# Patient Record
Sex: Female | Born: 2000 | Race: Black or African American | Hispanic: No | Marital: Single | Smoking: Never smoker
Health system: Southern US, Community
[De-identification: ages and names within clinical notes are randomized; demographics above are authoritative.]

## PROBLEM LIST (undated history)

## (undated) HISTORY — PX: WISDOM TOOTH EXTRACTION: SHX21

---
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
## 2018-08-14 ENCOUNTER — Ambulatory Visit: Payer: BLUE CROSS/BLUE SHIELD | Admitting: Women's Health

## 2018-08-14 ENCOUNTER — Encounter: Payer: Self-pay | Admitting: Women's Health

## 2018-08-14 VITALS — BP 110/78 | Ht 63.0 in | Wt 119.0 lb

## 2018-08-14 DIAGNOSIS — N920 Excessive and frequent menstruation with regular cycle: Secondary | ICD-10-CM | POA: Diagnosis not present

## 2018-08-14 DIAGNOSIS — Z113 Encounter for screening for infections with a predominantly sexual mode of transmission: Secondary | ICD-10-CM

## 2018-08-14 DIAGNOSIS — Z01419 Encounter for gynecological examination (general) (routine) without abnormal findings: Secondary | ICD-10-CM

## 2018-08-14 LAB — WET PREP FOR TRICH, YEAST, CLUE

## 2018-08-14 MED ORDER — NORETHIN ACE-ETH ESTRAD-FE 1-20 MG-MCG PO TABS: 1.0000 | ORAL_TABLET | Freq: Every day | ORAL | 4 refills | Status: AC

## 2018-08-14 NOTE — Patient Instructions (Signed)
Health Maintenance, Female Adopting a healthy lifestyle and getting preventive care can go a long way to promote health and wellness. Talk with your health care provider about what schedule of regular examinations is right for you. This is a good chance for you to check in with your provider about disease prevention and staying healthy. In between checkups, there are plenty of things you can do on your own. Experts have done a lot of research about which lifestyle changes and preventive measures are most likely to keep you healthy. Ask your health care provider for more information. Weight and diet Eat a healthy diet  Be sure to include plenty of vegetables, fruits, low-fat dairy products, and lean protein.  Do not eat a lot of foods high in solid fats, added sugars, or salt.  Get regular exercise. This is one of the most important things you can do for your health. ? Most adults should exercise for at least 150 minutes each week. The exercise should increase your heart rate and make you sweat (moderate-intensity exercise). ? Most adults should also do strengthening exercises at least twice a week. This is in addition to the moderate-intensity exercise.  Maintain a healthy weight  Body mass index (BMI) is a measurement that can be used to identify possible weight problems. It estimates body fat based on height and weight. Your health care provider can help determine your BMI and help you achieve or maintain a healthy weight.  For females 20 years of age and older: ? A BMI below 18.5 is considered underweight. ? A BMI of 18.5 to 24.9 is normal. ? A BMI of 25 to 29.9 is considered overweight. ? A BMI of 30 and above is considered obese.  Watch levels of cholesterol and blood lipids  You should start having your blood tested for lipids and cholesterol at 20 years of age, then have this test every 5 years.  You may need to have your cholesterol levels checked more often if: ? Your lipid or  cholesterol levels are high. ? You are older than 19 years of age. ? You are at high risk for heart disease.  Cancer screening Lung Cancer  Lung cancer screening is recommended for adults 55-80 years old who are at high risk for lung cancer because of a history of smoking.  A yearly low-dose CT scan of the lungs is recommended for people who: ? Currently smoke. ? Have quit within the past 15 years. ? Have at least a 30-pack-year history of smoking. A pack year is smoking an average of one pack of cigarettes a day for 1 year.  Yearly screening should continue until it has been 15 years since you quit.  Yearly screening should stop if you develop a health problem that would prevent you from having lung cancer treatment.  Breast Cancer  Practice breast self-awareness. This means understanding how your breasts normally appear and feel.  It also means doing regular breast self-exams. Let your health care provider know about any changes, no matter how small.  If you are in your 20s or 30s, you should have a clinical breast exam (CBE) by a health care provider every 1-3 years as part of a regular health exam.  If you are 40 or older, have a CBE every year. Also consider having a breast X-ray (mammogram) every year.  If you have a family history of breast cancer, talk to your health care provider about genetic screening.  If you are at high risk   for breast cancer, talk to your health care provider about having an MRI and a mammogram every year.  Breast cancer gene (BRCA) assessment is recommended for women who have family members with BRCA-related cancers. BRCA-related cancers include: ? Breast. ? Ovarian. ? Tubal. ? Peritoneal cancers.  Results of the assessment will determine the need for genetic counseling and BRCA1 and BRCA2 testing.  Cervical Cancer Your health care provider may recommend that you be screened regularly for cancer of the pelvic organs (ovaries, uterus, and  vagina). This screening involves a pelvic examination, including checking for microscopic changes to the surface of your cervix (Pap test). You may be encouraged to have this screening done every 3 years, beginning at age 22.  For women ages 56-65, health care providers may recommend pelvic exams and Pap testing every 3 years, or they may recommend the Pap and pelvic exam, combined with testing for human papilloma virus (HPV), every 5 years. Some types of HPV increase your risk of cervical cancer. Testing for HPV may also be done on women of any age with unclear Pap test results.  Other health care providers may not recommend any screening for nonpregnant women who are considered low risk for pelvic cancer and who do not have symptoms. Ask your health care provider if a screening pelvic exam is right for you.  If you have had past treatment for cervical cancer or a condition that could lead to cancer, you need Pap tests and screening for cancer for at least 20 years after your treatment. If Pap tests have been discontinued, your risk factors (such as having a new sexual partner) need to be reassessed to determine if screening should resume. Some women have medical problems that increase the chance of getting cervical cancer. In these cases, your health care provider may recommend more frequent screening and Pap tests.  Colorectal Cancer  This type of cancer can be detected and often prevented.  Routine colorectal cancer screening usually begins at 19 years of age and continues through 19 years of age.  Your health care provider may recommend screening at an earlier age if you have risk factors for colon cancer.  Your health care provider may also recommend using home test kits to check for hidden blood in the stool.  A small camera at the end of a tube can be used to examine your colon directly (sigmoidoscopy or colonoscopy). This is done to check for the earliest forms of colorectal  cancer.  Routine screening usually begins at age 33.  Direct examination of the colon should be repeated every 5-10 years through 19 years of age. However, you may need to be screened more often if early forms of precancerous polyps or small growths are found.  Skin Cancer  Check your skin from head to toe regularly.  Tell your health care provider about any new moles or changes in moles, especially if there is a change in a mole's shape or color.  Also tell your health care provider if you have a mole that is larger than the size of a pencil eraser.  Always use sunscreen. Apply sunscreen liberally and repeatedly throughout the day.  Protect yourself by wearing long sleeves, pants, a wide-brimmed hat, and sunglasses whenever you are outside.  Heart disease, diabetes, and high blood pressure  High blood pressure causes heart disease and increases the risk of stroke. High blood pressure is more likely to develop in: ? People who have blood pressure in the high end of  the normal range (130-139/85-89 mm Hg). ? People who are overweight or obese. ? People who are African American.  If you are 21-29 years of age, have your blood pressure checked every 3-5 years. If you are 3 years of age or older, have your blood pressure checked every year. You should have your blood pressure measured twice-once when you are at a hospital or clinic, and once when you are not at a hospital or clinic. Record the average of the two measurements. To check your blood pressure when you are not at a hospital or clinic, you can use: ? An automated blood pressure machine at a pharmacy. ? A home blood pressure monitor.  If you are between 17 years and 37 years old, ask your health care provider if you should take aspirin to prevent strokes.  Have regular diabetes screenings. This involves taking a blood sample to check your fasting blood sugar level. ? If you are at a normal weight and have a low risk for diabetes,  have this test once every three years after 19 years of age. ? If you are overweight and have a high risk for diabetes, consider being tested at a younger age or more often. Preventing infection Hepatitis B  If you have a higher risk for hepatitis B, you should be screened for this virus. You are considered at high risk for hepatitis B if: ? You were born in a country where hepatitis B is common. Ask your health care provider which countries are considered high risk. ? Your parents were born in a high-risk country, and you have not been immunized against hepatitis B (hepatitis B vaccine). ? You have HIV or AIDS. ? You use needles to inject street drugs. ? You live with someone who has hepatitis B. ? You have had sex with someone who has hepatitis B. ? You get hemodialysis treatment. ? You take certain medicines for conditions, including cancer, organ transplantation, and autoimmune conditions.  Hepatitis C  Blood testing is recommended for: ? Everyone born from [birth year] through 1967. ? Anyone with known risk factors for hepatitis C.  Sexually transmitted infections (STIs)  You should be screened for sexually transmitted infections (STIs) including gonorrhea and chlamydia if: ? You are sexually active and are younger than 19 years of age. ? You are older than 19 years of age and your health care provider tells you that you are at risk for this type of infection. ? Your sexual activity has changed since you were last screened and you are at an increased risk for chlamydia or gonorrhea. Ask your health care provider if you are at risk.  If you do not have HIV, but are at risk, it may be recommended that you take a prescription medicine daily to prevent HIV infection. This is called pre-exposure prophylaxis (PrEP). You are considered at risk if: ? You are sexually active and do not regularly use condoms or know the HIV status of your partner(s). ? You take drugs by injection. ? You are  sexually active with a partner who has HIV.  Talk with your health care provider about whether you are at high risk of being infected with HIV. If you choose to begin PrEP, you should first be tested for HIV. You should then be tested every 3 months for as long as you are taking PrEP. Pregnancy  If you are premenopausal and you may become pregnant, ask your health care provider about preconception counseling.  If you may become  pregnant, take 400 to 800 micrograms (mcg) of folic acid every day.  If you want to prevent pregnancy, talk to your health care provider about birth control (contraception). Osteoporosis and menopause  Osteoporosis is a disease in which the bones lose minerals and strength with aging. This can result in serious bone fractures. Your risk for osteoporosis can be identified using a bone density scan.  If you are 62 years of age or older, or if you are at risk for osteoporosis and fractures, ask your health care provider if you should be screened.  Ask your health care provider whether you should take a calcium or vitamin D supplement to lower your risk for osteoporosis.  Menopause may have certain physical symptoms and risks.  Hormone replacement therapy may reduce some of these symptoms and risks. Talk to your health care provider about whether hormone replacement therapy is right for you. Follow these instructions at home:  Schedule regular health, dental, and eye exams.  Stay current with your immunizations.  Do not use any tobacco products including cigarettes, chewing tobacco, or electronic cigarettes.  If you are pregnant, do not drink alcohol.  If you are breastfeeding, limit how much and how often you drink alcohol.  Limit alcohol intake to no more than 1 drink per day for nonpregnant women. One drink equals 12 ounces of beer, 5 ounces of wine, or 1 ounces of hard liquor.  Do not use street drugs.  Do not share needles.  Ask your health care  provider for help if you need support or information about quitting drugs.  Tell your health care provider if you often feel depressed.  Tell your health care provider if you have ever been abused or do not feel safe at home. This information is not intended to replace advice given to you by your health care provider. Make sure you discuss any questions you have with your health care provider. Document Released: 04/25/2011 Document Revised: 03/17/2016 Document Reviewed: 07/14/2015 Elsevier Interactive Patient Education  2018 [employer] Human Papillomavirus Quadrivalent Vaccine suspension for injection What is this medicine? HUMAN PAPILLOMAVIRUS VACCINE (HYOO muhn pap uh LOH muh vahy ruhs vak SEEN) is a vaccine. It is used to prevent infections of four types of the human papillomavirus. In women, the vaccine may lower your risk of getting cervical, vaginal, vulvar, or anal cancer and genital warts. In men, the vaccine may lower your risk of getting genital warts and anal cancer. You cannot get these diseases from the vaccine. This vaccine does not treat these diseases. This medicine may be used for other purposes; ask your health care provider or pharmacist if you have questions. COMMON BRAND NAME(S): Gardasil What should I tell my health care provider before I take this medicine? They need to know if you have any of these conditions: -fever or infection -hemophilia -HIV infection or AIDS -immune system problems -low platelet count -an unusual reaction to Human Papillomavirus Vaccine, yeast, other medicines, foods, dyes, or preservatives -pregnant or trying to get pregnant -breast-feeding How should I use this medicine? This vaccine is for injection in a muscle on your upper arm or thigh. It is given by a health care professional. Dennis Bast will be observed for 15 minutes after each dose. Sometimes, fainting happens after the vaccine is given. You may be asked to sit or lie down during the 15  minutes. Three doses are given. The second dose is given 2 months after the first dose. The last dose is given 4 months  after the second dose. A copy of a Vaccine Information Statement will be given before each vaccination. Read this sheet carefully each time. The sheet may change frequently. Talk to your pediatrician regarding the use of this medicine in children. While this drug may be prescribed for children as Madysin Crisp as 69 years of age for selected conditions, precautions do apply. Overdosage: If you think you have taken too much of this medicine contact a poison control center or emergency room at once. NOTE: This medicine is only for you. Do not share this medicine with others. What if I miss a dose? All 3 doses of the vaccine should be given within 6 months. Remember to keep appointments for follow-up doses. Your health care provider will tell you when to return for the next vaccine. Ask your health care professional for advice if you are unable to keep an appointment or miss a scheduled dose. What may interact with this medicine? -other vaccines This list may not describe all possible interactions. Give your health care provider a list of all the medicines, herbs, non-prescription drugs, or dietary supplements you use. Also tell them if you smoke, drink alcohol, or use illegal drugs. Some items may interact with your medicine. What should I watch for while using this medicine? This vaccine may not fully protect everyone. Continue to have regular pelvic exams and cervical or anal cancer screenings as directed by your doctor. The Human Papillomavirus is a sexually transmitted disease. It can be passed by any kind of sexual activity that involves genital contact. The vaccine works best when given before you have any contact with the virus. Many people who have the virus do not have any signs or symptoms. Tell your doctor or health care professional if you have any reaction or unusual symptom after  getting the vaccine. What side effects may I notice from receiving this medicine? Side effects that you should report to your doctor or health care professional as soon as possible: -allergic reactions like skin rash, itching or hives, swelling of the face, lips, or tongue -breathing problems -feeling faint or lightheaded, falls Side effects that usually do not require medical attention (report to your doctor or health care professional if they continue or are bothersome): -cough -dizziness -fever -headache -nausea -redness, warmth, swelling, pain, or itching at site where injected This list may not describe all possible side effects. Call your doctor for medical advice about side effects. You may report side effects to FDA at 1-800-FDA-1088. Where should I keep my medicine? This drug is given in a hospital or clinic and will not be stored at home. NOTE: This sheet is a summary. It may not cover all possible information. If you have questions about this medicine, talk to your doctor, pharmacist, or health care provider.  2018 Elsevier/Gold Standard (2013-12-02 13:14:33)

## 2018-08-14 NOTE — Progress Notes (Signed)
Jill Fowler Dec 15, 2000 [id number]    History:    Presents for annual exam.  Monthly cycle, states last cycle heavier than most, inconsistent condom use.  Did not receive Gardasil.  New partner.  Past medical history, past surgical history, family history and social history were all reviewed and documented in the EPIC chart.  Dental hygiene program at [facility], dentist goal.  Parents healthy  ROS:  A ROS was performed and pertinent positives and negatives are included.  Exam:  Vitals:   08/14/18 1152  BP: 110/78  Weight: 119 lb (54 kg)  Height: 5\' 3"  (1.6 m)   Body mass index is 21.08 kg/m.   General appearance:  Normal Thyroid:  Symmetrical, normal in size, without palpable masses or nodularity. Respiratory  Auscultation:  Clear without wheezing or rhonchi Cardiovascular  Auscultation:  Regular rate, without rubs, murmurs or gallops  Edema/varicosities:  Not grossly evident Abdominal  Soft,nontender, without masses, guarding or rebound.  Liver/spleen:  No organomegaly noted  Hernia:  None appreciated  Skin  Inspection:  Grossly normal   Breasts: Examined lying and sitting.     Right: Without masses, retractions, discharge or axillary adenopathy.     Left: Without masses, retractions, discharge or axillary adenopathy. Gentitourinary   Inguinal/mons:  Normal without inguinal adenopathy  External genitalia:  Normal  BUS/Urethra/Skene's glands:  Normal  Vagina:  Normal wet prep negative  Cervix:  Normal  Uterus: Retro-verted, normal in size, shape and contour.  Midline and mobile  Adnexa/parametria:     Rt: Without masses or tenderness.   Lt: Without masses or tenderness.  Anus and perineum: Normal    Assessment/Plan:  17 y.o. SBF G0 for annual exam.   Monthly cycle STD screen Contraception management  Plan: Gardasil reviewed and encouraged, if decides  would like to proceed will call office and schedule appointment.  Contraception options reviewed, would like to  try pill, Loestrin 1/20 prescription, proper use, slight risk for blood clots and stricture reviewed.  On last day of cycle will start today, reviewed importance of abstinence or condoms first month on pills not contraceptive.  Condoms encouraged until permanent partner.  Reports last cycle was at its normal time but heavier than most questions if it was a miscarriage.  Will check hCG.  SBE's, exercise, calcium rich foods, MVI daily encouraged.  Campus safety reviewed.  CBC, GC/chlamydia, HIV, hep B, C, RPR.   Harrington Challenger [facility], 1:13 PM 08/14/2018

## 2018-08-15 LAB — CBC WITH DIFFERENTIAL/PLATELET
BASOS ABS: 31 {cells}/uL (ref 0–200)
Basophils Relative: 0.9 %
EOS ABS: 10 {cells}/uL — AB (ref 15–500)
Eosinophils Relative: 0.3 %
HEMATOCRIT: 31.1 % — AB (ref 34.0–46.0)
HEMOGLOBIN: 9.8 g/dL — AB (ref 11.5–15.3)
Lymphs Abs: 1503 cells/uL (ref 1200–5200)
MCH: 25.5 pg (ref 25.0–35.0)
MCHC: 31.5 g/dL (ref 31.0–36.0)
MCV: 80.8 fL (ref 78.0–98.0)
MPV: 10.9 fL (ref 7.5–12.5)
Monocytes Relative: 9.1 %
NEUTROS ABS: 1547 {cells}/uL — AB (ref 1800–8000)
NEUTROS PCT: 45.5 %
Platelets: 288 10*3/uL (ref 140–400)
RBC: 3.85 10*6/uL (ref 3.80–5.10)
RDW: 12.5 % (ref 11.0–15.0)
Total Lymphocyte: 44.2 %
WBC: 3.4 10*3/uL — ABNORMAL LOW (ref 4.5–13.0)
WBCMIX: 309 {cells}/uL (ref 200–900)

## 2018-08-15 LAB — HCG, SERUM, QUALITATIVE: PREG SERUM: NEGATIVE

## 2018-08-15 LAB — RPR: RPR Ser Ql: NONREACTIVE

## 2018-08-15 LAB — HEPATITIS B SURFACE ANTIGEN: Hepatitis B Surface Ag: NONREACTIVE

## 2018-08-15 LAB — HEPATITIS C ANTIBODY
Hepatitis C Ab: NONREACTIVE
SIGNAL TO CUT-OFF: 0.02 (ref <1.00)

## 2018-08-15 LAB — C. TRACHOMATIS/N. GONORRHOEAE RNA
C. TRACHOMATIS RNA, TMA: NOT DETECTED
N. GONORRHOEAE RNA, TMA: NOT DETECTED

## 2018-08-15 LAB — HIV ANTIBODY (ROUTINE TESTING W REFLEX): HIV: NONREACTIVE

## 2018-08-16 ENCOUNTER — Other Ambulatory Visit: Payer: Self-pay | Admitting: *Deleted

## 2018-08-16 DIAGNOSIS — D649 Anemia, unspecified: Secondary | ICD-10-CM

## 2019-05-10 DIAGNOSIS — H5213 Myopia, bilateral: Secondary | ICD-10-CM | POA: Diagnosis not present
